# Patient Record
Sex: Male | Born: 1965 | Race: White | Hispanic: No
Health system: Southern US, Community
[De-identification: ages and names within clinical notes are randomized; demographics above are authoritative.]

---

## 2019-03-20 ENCOUNTER — Inpatient Hospital Stay (HOSPITAL_COMMUNITY)
Admission: AD | Admit: 2019-03-20 | Discharge: 2019-03-23 | DRG: 091 | Disposition: A | Discharge status: Home / Self Care | Payer: Self-pay | Source: Other Acute Inpatient Hospital | Attending: Internal Medicine | Admitting: Internal Medicine

## 2019-03-20 ENCOUNTER — Inpatient Hospital Stay (HOSPITAL_COMMUNITY): Payer: Self-pay

## 2019-03-20 DIAGNOSIS — J9602 Acute respiratory failure with hypercapnia: Secondary | ICD-10-CM | POA: Diagnosis present

## 2019-03-20 DIAGNOSIS — J9601 Acute respiratory failure with hypoxia: Secondary | ICD-10-CM | POA: Diagnosis present

## 2019-03-20 DIAGNOSIS — R4182 Altered mental status, unspecified: Secondary | ICD-10-CM

## 2019-03-20 DIAGNOSIS — G92 Toxic encephalopathy: Principal | ICD-10-CM | POA: Diagnosis present

## 2019-03-20 DIAGNOSIS — M6282 Rhabdomyolysis: Secondary | ICD-10-CM | POA: Diagnosis present

## 2019-03-20 DIAGNOSIS — Z20828 Contact with and (suspected) exposure to other viral communicable diseases: Secondary | ICD-10-CM | POA: Diagnosis present

## 2019-03-20 DIAGNOSIS — F1510 Other stimulant abuse, uncomplicated: Secondary | ICD-10-CM | POA: Diagnosis present

## 2019-03-20 DIAGNOSIS — G9340 Encephalopathy, unspecified: Secondary | ICD-10-CM | POA: Diagnosis present

## 2019-03-20 DIAGNOSIS — N179 Acute kidney failure, unspecified: Secondary | ICD-10-CM

## 2019-03-20 DIAGNOSIS — E875 Hyperkalemia: Secondary | ICD-10-CM | POA: Diagnosis present

## 2019-03-20 MED ORDER — ORAL CARE MOUTH RINSE
15.0000 mL | OROMUCOSAL | Status: DC
  Administered 2019-03-21 (×4): 15 mL via OROMUCOSAL

## 2019-03-20 MED ORDER — BISACODYL 10 MG RE SUPP: 10.0000 mg | Freq: Every day | RECTAL | Status: DC | PRN

## 2019-03-20 MED ORDER — DOCUSATE SODIUM 50 MG/5ML PO LIQD
100.0000 mg | Freq: Two times a day (BID) | ORAL | Status: DC | PRN
  Filled 2019-03-20: qty 10

## 2019-03-20 MED ORDER — FENTANYL BOLUS VIA INFUSION
50.0000 ug | INTRAVENOUS | Status: DC | PRN
  Filled 2019-03-20: qty 50

## 2019-03-20 MED ORDER — PROPOFOL 1000 MG/100ML IV EMUL
0.0000 ug/kg/min | INTRAVENOUS | Status: DC
  Administered 2019-03-21: 10 ug/kg/min via INTRAVENOUS

## 2019-03-20 MED ORDER — LACTATED RINGERS IV SOLN
INTRAVENOUS | Status: DC
  Administered 2019-03-21: via INTRAVENOUS

## 2019-03-20 MED ORDER — HEPARIN SODIUM (PORCINE) 5000 UNIT/ML IJ SOLN
5000.0000 [IU] | Freq: Three times a day (TID) | INTRAMUSCULAR | Status: DC
  Administered 2019-03-22 – 2019-03-23 (×3): 5000 [IU] via SUBCUTANEOUS
  Filled 2019-03-20 (×2): qty 1

## 2019-03-20 MED ORDER — FENTANYL 2500MCG IN NS 250ML (10MCG/ML) PREMIX INFUSION
50.0000 ug/h | INTRAVENOUS | Status: DC
  Administered 2019-03-20: 150 ug/h via INTRAVENOUS
  Filled 2019-03-20: qty 250

## 2019-03-20 MED ORDER — ALBUTEROL SULFATE (2.5 MG/3ML) 0.083% IN NEBU: 2.5000 mg | INHALATION_SOLUTION | Freq: Four times a day (QID) | RESPIRATORY_TRACT | Status: DC | PRN

## 2019-03-20 MED ORDER — CHLORHEXIDINE GLUCONATE 0.12% ORAL RINSE (MEDLINE KIT)
15.0000 mL | Freq: Two times a day (BID) | OROMUCOSAL | Status: DC
  Administered 2019-03-21 (×3): 15 mL via OROMUCOSAL

## 2019-03-20 MED ORDER — PROPOFOL 1000 MG/100ML IV EMUL
INTRAVENOUS | Status: AC
  Filled 2019-03-20: qty 100

## 2019-03-20 MED ORDER — FENTANYL CITRATE (PF) 100 MCG/2ML IJ SOLN: 50.0000 ug | Freq: Once | INTRAMUSCULAR | Status: DC

## 2019-03-20 MED ORDER — FAMOTIDINE IN NACL 20-0.9 MG/50ML-% IV SOLN
20.0000 mg | Freq: Two times a day (BID) | INTRAVENOUS | Status: DC
  Administered 2019-03-21 (×2): 20 mg via INTRAVENOUS
  Filled 2019-03-20 (×3): qty 50

## 2019-03-20 MED ORDER — SODIUM CHLORIDE 0.9 % IV SOLN
2.0000 g | Freq: Every day | INTRAVENOUS | Status: DC
  Administered 2019-03-21: 2 g via INTRAVENOUS
  Filled 2019-03-20: qty 20

## 2019-03-20 NOTE — H&P (Signed)
NAME:  Scott Ford, MRN:  [id number], DOB:  [birth year], LOS: 0 ADMISSION DATE:  (Not on file), CONSULTATION DATE:  03/20/19 , CHIEF COMPLAINT:  Altered mentation  Brief History   Patient with history of Meth use comes in for AMS. Intubated at [facility].   History of present illness   This is a 53 yo with history of meth amphematine abuse who presents to [facility] for chief complaint of agitation. Patient was apparently at home. The police had been contacted and were on the way to see the patient. Patient ran out of the house. Mother saw him. Of note he lives with mother. He was naked at this point. Apparently patient ran all the way to the hospital naked. In ED patient extremely agitated. Stating absurd things like "the doctors are trying to cut off my penis." Patient became increasingly combative at which point sedating medications were given. This caused concern for the patients airway and thus he was intubated. Patient presents here for further workup and management.   Notably Patient had CT head that was negative. CT C spine negative. LA initially 6 repeat was normal. Had AKI with creatinine of 2.81 Temp of 101.3 CK around 2000. CSF attained and notable for 0-WBC's 2537 RBC's.   Past Medical History  Meth abuse.   Significant Hospital Events   7/17-Admitted from [place]:  NA  Procedures:  NA  Significant Diagnostic Tests:  COVID-test re sent. [place] -pending UCX -pending  Micro Data:  Pending  Antimicrobials:  CTX  Interim history/subjective:  NA  Objective   There were no vitals taken for this visit. PAP: ()/()      No intake or output data in the 24 hours ending 03/20/19 2244 There were no vitals filed for this visit.  Examination: General: Patient intubated and sedated HENT: Moist mucopus membranes Lungs: CTAB Cardiovascular: RRR Abdomen: Soft non tender non distended. There is tattoo that states 100% white trash Extremities: Warm, good  pulses, no edema Neuro: Moves extremities spontaneously  GU: Foley in place, circumcised penis  Resolved Hospital Problem list   NA  Assessment & Plan:  This is a 53 yo with history of substance abuse who presents for acute agitation requiring sedation and intubation.   Encephalopathy NOS-Unlikely meningitis as no whites in CSF. CT head unremarkable making acute intracranial process unlikely. [place] from drug overdose especially in setting of positive UA -Fluids for now -Propofol and fentanyl for sedation and analgesia -CTX in case pneumonia or UTI. However infection seems less likely and fever likey from adrenergic overstimulation   AKI-Patient with creatinine of 2.81. Also with CK of 2000. 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  Family Communication: Have attempted but there are no numbers in chart Disposition: ICU for now  Labs   CBC: No results for input(s): WBC, NEUTROABS, HGB, HCT, MCV, PLT in the last 168 hours.  Basic Metabolic Panel: No results for input(s): NA, K, CL, CO2, GLUCOSE, BUN, CREATININE, CALCIUM, MG, PHOS in the last 168 hours. GFR: CrCl cannot be calculated (No successful lab  value found.). No results for input(s): PROCALCITON, WBC, LATICACIDVEN in the last 168 hours.  Liver Function Tests: No results for input(s): AST, ALT, ALKPHOS, BILITOT, PROT, ALBUMIN in the last 168 hours. No results for input(s): LIPASE, AMYLASE in the last 168 hours. No  results for input(s): AMMONIA in the last 168 hours.  ABG No results found for: PHART, PCO2ART, PO2ART, HCO3, TCO2, ACIDBASEDEF, O2SAT   Coagulation Profile: No results for input(s): INR, PROTIME in the last 168 hours.  Cardiac Enzymes: No results for input(s): CKTOTAL, CKMB, CKMBINDEX, TROPONINI in the last 168 hours.  HbA1C: No results found for: HGBA1C  CBG: No results for input(s): GLUCAP in the last 168 hours.  Review of Systems:     Past Medical History  He,  has no past medical history on file.   Surgical History      Social History      Family History   His family history is not on file.   Allergies Not on File   Home Medications  Prior to Admission medications   Not on File     Critical care time: 55 minutes

## 2019-03-21 ENCOUNTER — Inpatient Hospital Stay (HOSPITAL_COMMUNITY): Payer: Self-pay

## 2019-03-21 DIAGNOSIS — N179 Acute kidney failure, unspecified: Secondary | ICD-10-CM

## 2019-03-21 DIAGNOSIS — G9340 Encephalopathy, unspecified: Secondary | ICD-10-CM

## 2019-03-21 DIAGNOSIS — J9600 Acute respiratory failure, unspecified whether with hypoxia or hypercapnia: Secondary | ICD-10-CM

## 2019-03-21 LAB — POCT I-STAT 7, (LYTES, BLD GAS, ICA,H+H)
Acid-base deficit: 3 mmol/L — ABNORMAL HIGH (ref 0.0–2.0)
Bicarbonate: 24.2 mmol/L (ref 20.0–28.0)
Calcium, Ion: 1.26 mmol/L (ref 1.15–1.40)
HCT: 42 % (ref 39.0–52.0)
Hemoglobin: 14.3 g/dL (ref 13.0–17.0)
O2 Saturation: 99 %
Patient temperature: 97.6
Potassium: 3.9 mmol/L (ref 3.5–5.1)
Sodium: 144 mmol/L (ref 135–145)
TCO2: 26 mmol/L (ref 22–32)
pCO2 arterial: 50.7 mmHg — ABNORMAL HIGH (ref 32.0–48.0)
pH, Arterial: 7.283 — ABNORMAL LOW (ref 7.350–7.450)
pO2, Arterial: 161 mmHg — ABNORMAL HIGH (ref 83.0–108.0)

## 2019-03-21 LAB — CBC WITH DIFFERENTIAL/PLATELET
Abs Immature Granulocytes: 0.03 10*3/uL (ref 0.00–0.07)
Basophils Absolute: 0.1 10*3/uL (ref 0.0–0.1)
Basophils Relative: 1 %
Eosinophils Absolute: 0.3 10*3/uL (ref 0.0–0.5)
Eosinophils Relative: 2 %
HCT: 48 % (ref 39.0–52.0)
Hemoglobin: 15.6 g/dL (ref 13.0–17.0)
Immature Granulocytes: 0 %
Lymphocytes Relative: 17 %
Lymphs Abs: 1.8 10*3/uL (ref 0.7–4.0)
MCH: 32.2 pg (ref 26.0–34.0)
MCHC: 32.5 g/dL (ref 30.0–36.0)
MCV: 99.2 fL (ref 80.0–100.0)
Monocytes Absolute: 1.8 10*3/uL — ABNORMAL HIGH (ref 0.1–1.0)
Monocytes Relative: 17 %
Neutro Abs: 6.8 10*3/uL (ref 1.7–7.7)
Neutrophils Relative %: 63 %
Platelets: 173 10*3/uL (ref 150–400)
RBC: 4.84 MIL/uL (ref 4.22–5.81)
RDW: 14.7 % (ref 11.5–15.5)
WBC: 10.8 10*3/uL — ABNORMAL HIGH (ref 4.0–10.5)
nRBC: 0 % (ref 0.0–0.2)

## 2019-03-21 LAB — URINALYSIS, ROUTINE W REFLEX MICROSCOPIC
Bacteria, UA: NONE SEEN
Bilirubin Urine: NEGATIVE
Glucose, UA: NEGATIVE mg/dL
Ketones, ur: NEGATIVE mg/dL
Leukocytes,Ua: NEGATIVE
Nitrite: NEGATIVE
Protein, ur: 100 mg/dL — AB
Specific Gravity, Urine: 1.012 (ref 1.005–1.030)
pH: 5 (ref 5.0–8.0)

## 2019-03-21 LAB — COMPREHENSIVE METABOLIC PANEL
ALT: 37 U/L (ref 0–44)
AST: 82 U/L — ABNORMAL HIGH (ref 15–41)
Albumin: 4.4 g/dL (ref 3.5–5.0)
Alkaline Phosphatase: 42 U/L (ref 38–126)
Anion gap: 10 (ref 5–15)
BUN: 41 mg/dL — ABNORMAL HIGH (ref 6–20)
CO2: 19 mmol/L — ABNORMAL LOW (ref 22–32)
Calcium: 8.7 mg/dL — ABNORMAL LOW (ref 8.9–10.3)
Chloride: 112 mmol/L — ABNORMAL HIGH (ref 98–111)
Creatinine, Ser: 2.22 mg/dL — ABNORMAL HIGH (ref 0.61–1.24)
GFR calc Af Amer: 38 mL/min — ABNORMAL LOW (ref >60)
GFR calc non Af Amer: 33 mL/min — ABNORMAL LOW (ref >60)
Glucose, Bld: 80 mg/dL (ref 70–99)
Potassium: 5.2 mmol/L — ABNORMAL HIGH (ref 3.5–5.1)
Sodium: 141 mmol/L (ref 135–145)
Total Bilirubin: 1.7 mg/dL — ABNORMAL HIGH (ref 0.3–1.2)
Total Protein: 6.6 g/dL (ref 6.5–8.1)

## 2019-03-21 LAB — GLUCOSE, CAPILLARY
Glucose-Capillary: 127 mg/dL — ABNORMAL HIGH (ref 70–99)
Glucose-Capillary: 61 mg/dL — ABNORMAL LOW (ref 70–99)
Glucose-Capillary: 72 mg/dL (ref 70–99)
Glucose-Capillary: 85 mg/dL (ref 70–99)
Glucose-Capillary: 88 mg/dL (ref 70–99)
Glucose-Capillary: 93 mg/dL (ref 70–99)
Glucose-Capillary: 95 mg/dL (ref 70–99)

## 2019-03-21 LAB — HIV ANTIBODY (ROUTINE TESTING W REFLEX): HIV Screen 4th Generation wRfx: NONREACTIVE

## 2019-03-21 LAB — SODIUM, URINE, RANDOM: Sodium, Ur: 38 mmol/L

## 2019-03-21 LAB — SARS CORONAVIRUS 2 BY RT PCR (HOSPITAL ORDER, PERFORMED IN ~~LOC~~ HOSPITAL LAB): SARS Coronavirus 2: NEGATIVE

## 2019-03-21 LAB — LACTIC ACID, PLASMA
Lactic Acid, Venous: 1.3 mmol/L (ref 0.5–1.9)
Lactic Acid, Venous: 0.8 mmol/L (ref 0.5–1.9)

## 2019-03-21 LAB — TRIGLYCERIDES: Triglycerides: 103 mg/dL (ref <150)

## 2019-03-21 LAB — CREATININE, URINE, RANDOM: Creatinine, Urine: 124.34 mg/dL

## 2019-03-21 LAB — MRSA PCR SCREENING: MRSA by PCR: POSITIVE — AB

## 2019-03-21 LAB — PROCALCITONIN: Procalcitonin: 3.19 ng/mL

## 2019-03-21 LAB — PHOSPHORUS: Phosphorus: 4.5 mg/dL (ref 2.5–4.6)

## 2019-03-21 LAB — CK: Total CK: 4613 U/L — ABNORMAL HIGH (ref 49–397)

## 2019-03-21 LAB — MAGNESIUM: Magnesium: 2.8 mg/dL — ABNORMAL HIGH (ref 1.7–2.4)

## 2019-03-21 MED ORDER — LORAZEPAM 2 MG/ML IJ SOLN
1.0000 mg | INTRAMUSCULAR | Status: DC | PRN
  Administered 2019-03-21 – 2019-03-22 (×3): 2 mg via INTRAVENOUS
  Filled 2019-03-21 (×3): qty 1

## 2019-03-21 MED ORDER — DEXTROSE 50 % IV SOLN
INTRAVENOUS | Status: AC
  Administered 2019-03-21: 50 mL
  Filled 2019-03-21: qty 50

## 2019-03-21 MED ORDER — DEXMEDETOMIDINE HCL IN NACL 400 MCG/100ML IV SOLN
0.4000 ug/kg/h | INTRAVENOUS | Status: DC
  Administered 2019-03-21: 12:00:00 0.4 ug/kg/h via INTRAVENOUS
  Administered 2019-03-21: 1.2 ug/kg/h via INTRAVENOUS
  Filled 2019-03-21 (×2): qty 100

## 2019-03-21 MED ORDER — THIAMINE HCL 100 MG/ML IJ SOLN
100.0000 mg | Freq: Every day | INTRAMUSCULAR | Status: DC
  Administered 2019-03-21: 100 mg via INTRAVENOUS
  Filled 2019-03-21: qty 2

## 2019-03-21 MED ORDER — MUPIROCIN 2 % EX OINT
1.0000 "application " | TOPICAL_OINTMENT | Freq: Two times a day (BID) | CUTANEOUS | Status: DC
  Administered 2019-03-21 – 2019-03-22 (×5): 1 via NASAL
  Filled 2019-03-21 (×3): qty 22

## 2019-03-21 MED ORDER — DEXTROSE IN LACTATED RINGERS 5 % IV SOLN
INTRAVENOUS | Status: DC
  Administered 2019-03-21 – 2019-03-23 (×2): via INTRAVENOUS

## 2019-03-21 MED ORDER — CHLORHEXIDINE GLUCONATE CLOTH 2 % EX PADS: 6.0000 | MEDICATED_PAD | Freq: Every day | CUTANEOUS | Status: DC

## 2019-03-21 MED ORDER — ADULT MULTIVITAMIN W/MINERALS CH
1.0000 | ORAL_TABLET | Freq: Every day | ORAL | Status: DC
  Administered 2019-03-21 – 2019-03-23 (×3): 1 via ORAL
  Filled 2019-03-21 (×3): qty 1

## 2019-03-21 MED ORDER — ORAL CARE MOUTH RINSE
15.0000 mL | Freq: Two times a day (BID) | OROMUCOSAL | Status: DC
  Administered 2019-03-21 (×2): 15 mL via OROMUCOSAL

## 2019-03-21 MED ORDER — PROPOFOL 1000 MG/100ML IV EMUL
0.0000 ug/kg/min | INTRAVENOUS | Status: DC
  Administered 2019-03-21: 10 ug/kg/min via INTRAVENOUS

## 2019-03-21 NOTE — Progress Notes (Signed)
NAME:  Scott Ford, MRN:  [id number], DOB:  [birth year], LOS: 1 ADMISSION DATE:  03/20/2019, CONSULTATION DATE:  03/20/19 , CHIEF COMPLAINT:  Altered mentation  Brief History   Patient with history of Meth use comes in for AMS. [facility]   History of present illness   This is a 53 yo with history of meth amphematine abuse who [facility]. Patient was apparently at home. The police had been contacted and were on the way to see the patient. Patient ran out of the house. Mother saw him. Of note he lives with mother. He was naked at this point. Apparently patient ran all the way to the hospital naked. In ED patient extremely agitated. Stating absurd things like "the doctors are trying to cut off my penis." Patient became increasingly combative at which point sedating medications were given. This caused concern for the patients airway and thus he was intubated. Patient presents here for further workup and management.   Notably Patient had CT head that was negative. CT C spine negative. LA initially 6 repeat was normal. Had AKI with creatinine of 2.81 Temp of 101.3 CK around 2000. CSF attained and notable for 0-WBC's 2537 RBC's.   Past Medical History  Meth abuse.   Significant Hospital Events   [facility]  Consults:  NA  Procedures:  NA  Significant Diagnostic Tests:  Head CT 7/17 neg US renal 7/18 >> neg  Micro Data:  COVID-test neg BCX >> UCX >>  Antimicrobials:  CTX 7/18 >>  Interim history/subjective:   Calmer this morning when propofol drip turned off Afebrile   Objective   Blood pressure 102/70, pulse 76, temperature 97.6 F (36.4 C), temperature source Oral, resp. rate (!) 24, height 6' (1.829 m), weight 73.5 kg, SpO2 100 %.    Vent Mode: PSV;CPAP FiO2 (%):  [30 %-40 %] 30 % Set Rate:  [18 bmp-24 bmp] 24 bmp Vt Set:  [450 mL] 450 mL PEEP:  [5 cmH20] 5 cmH20 Pressure  Support:  [5 cmH20] 5 cmH20 Plateau Pressure:  [12 cmH20-15 cmH20] 15 cmH20   Intake/Output Summary (Last 24 hours) at 03/21/2019 0833 Last data filed at 03/21/2019 0800 Gross per 24 hour  Intake 1467.48 ml  Output 410 ml  Net 1057.48 ml   Filed Weights   03/20/19 2330 03/21/19 0414  Weight: 73.4 kg 73.5 kg    Examination: General: Patient intubated and sedated HENT: Moist mucopus membranes, no pallor or icterus Lungs: CTAB Cardiovascular: RRR Abdomen: Soft non tender non distended.  Tattoo noted -' 100% white trash' Extremities: Warm, good pulses, no edema Neuro: Moves extremities spontaneously  GU: Foley in place, circumcised penis  Chest x-ray 7/17 personally reviewed no infiltrates  Resolved Hospital Problem list   NA  Assessment & Plan:  This is a 53 yo with history of substance abuse who presents for acute agitation requiring sedation and intubation.   Acute Encephalopathy NOS-Unlikely meningitis as no whites in CSF. CT head unremarkable making acute intracranial process unlikely. Likely from drug overdose especially in setting of positive UA -resolved -We will use Precedex for agitation and monitor for withdrawal -Discontinue ceftriaxone, fever likey from adrenergic overstimulation  Polysubstance abuse- Patient with history of meth abuse -Amphetamines positive  -Monitor for withdrawal -MVI and Thiamine  Acute respiratory failure-intubated for agitation/airway protection -Tolerating pressure support 5/5 this morning and calmer, will proceed with extubation  AKI-Patient with creatinine of 2.81. 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  Family Communication: no contacts identified Disposition: ICU, transfer to floor if agitation controlled  The patient is critically ill with multiple organ systems failure and requires high complexity decision making for assessment and support, frequent evaluation and titration of therapies, application of advanced monitoring technologies and extensive interpretation of multiple databases. Critical Care Time devoted to patient care services described in this note independent of APP/resident  time is 32 minutes.   Kara Mead MD. Shade Flood. [facility] Pager [phone] If no response call [phone]   03/21/2019

## 2019-03-21 NOTE — Progress Notes (Signed)
Pt is alert and answers questions appropriately but is acting confused, such as pulling at lines and asking repetitive questions. He ripped out one of his IVs and ripped out his foley catheter. Precedex started.

## 2019-03-21 NOTE — Progress Notes (Addendum)
Pt had a sudden episode of anxiety. He got out of the bed and stood up stating he was "getting out of here" and expressed concern about the cops coming for him that he ripped off all his cardiac leads and pulse ox; he broke the cord of his BP cuff when attempting to remove it and ripped out the IV in his left hand (pressure applied, bleeding controlled immediately). Multiple nurses in room to deescalate the patient. Dr. Elsworth Soho also happened to be on the unit to witness this episode. After about 3-5 minutes of convincing the patient to get back into the bed, he complied and returned to the bed. Pt finally calmed down.Pt able to be deescalated. Paper scrub pants provided to patient as requested. Staff RN at his bedside as 1:1 Actuary.

## 2019-03-21 NOTE — Progress Notes (Signed)
Wasted IV medications from [facility] hospital with Brien Mates, RN:  Bags mixed in transport. Switching to our IV medication bags and tubing.   Fentanyl- 252mL Versed- 10 mL Ativan- 150 mL  Weldon Inches, RN

## 2019-03-21 NOTE — Progress Notes (Signed)
Hypoglycemic event.  Glucose: 61.   Gave 1 amp of dextrose. MD added LR with D5 at 150 mL/hr. Will continue to monitor.   Weldon Inches, RN

## 2019-03-21 NOTE — Procedures (Signed)
Extubation Procedure Note  Patient Details:   Name: Scott Ford DOB: September 17, 1965 MRN: [id number]   Airway Documentation:    Vent end date: 03/21/19 Vent end time: 0830   Evaluation  O2 sats: stable throughout Complications: No apparent complications Patient did tolerate procedure well. Bilateral Breath Sounds: Clear, Diminished   Yes   Patient extubated to 3L [place] without complications. RN at bedside. Positive cuff leak. No stridor noted.   Herbie Baltimore 03/21/2019, 8:37 AM

## 2019-03-22 DIAGNOSIS — T796XXA Traumatic ischemia of muscle, initial encounter: Secondary | ICD-10-CM

## 2019-03-22 LAB — BASIC METABOLIC PANEL
Anion gap: 8 (ref 5–15)
BUN: 30 mg/dL — ABNORMAL HIGH (ref 6–20)
CO2: 24 mmol/L (ref 22–32)
Calcium: 8.5 mg/dL — ABNORMAL LOW (ref 8.9–10.3)
Chloride: 110 mmol/L (ref 98–111)
Creatinine, Ser: 1.42 mg/dL — ABNORMAL HIGH (ref 0.61–1.24)
GFR calc Af Amer: >60 mL/min (ref >60)
GFR calc non Af Amer: 56 mL/min — ABNORMAL LOW (ref >60)
Glucose, Bld: 84 mg/dL (ref 70–99)
Potassium: 4.3 mmol/L (ref 3.5–5.1)
Sodium: 142 mmol/L (ref 135–145)

## 2019-03-22 LAB — CBC
HCT: 44.5 % (ref 39.0–52.0)
Hemoglobin: 15 g/dL (ref 13.0–17.0)
MCH: 32.3 pg (ref 26.0–34.0)
MCHC: 33.7 g/dL (ref 30.0–36.0)
MCV: 95.7 fL (ref 80.0–100.0)
Platelets: 177 10*3/uL (ref 150–400)
RBC: 4.65 MIL/uL (ref 4.22–5.81)
RDW: 14.2 % (ref 11.5–15.5)
WBC: 7.2 10*3/uL (ref 4.0–10.5)
nRBC: 0 % (ref 0.0–0.2)

## 2019-03-22 LAB — PHOSPHORUS: Phosphorus: 3.4 mg/dL (ref 2.5–4.6)

## 2019-03-22 LAB — URINE CULTURE: Culture: NO GROWTH

## 2019-03-22 LAB — CK: Total CK: 2792 U/L — ABNORMAL HIGH (ref 49–397)

## 2019-03-22 LAB — MAGNESIUM: Magnesium: 2.3 mg/dL (ref 1.7–2.4)

## 2019-03-22 MED ORDER — FAMOTIDINE 20 MG PO TABS
20.0000 mg | ORAL_TABLET | Freq: Two times a day (BID) | ORAL | Status: DC
  Administered 2019-03-22 – 2019-03-23 (×3): 20 mg via ORAL
  Filled 2019-03-22 (×3): qty 1

## 2019-03-22 MED ORDER — VITAMIN B-1 100 MG PO TABS
100.0000 mg | ORAL_TABLET | Freq: Every day | ORAL | Status: DC
  Administered 2019-03-22 – 2019-03-23 (×2): 100 mg via ORAL
  Filled 2019-03-22 (×2): qty 1

## 2019-03-22 NOTE — Progress Notes (Signed)
PT Cancellation Note  Patient Details Name: Scott Ford MRN: [id number] DOB: Aug 15, 1966   Cancelled Treatment:    Reason Eval/Treat Not Completed: Patient declined, no reason specified. Adamantly refusing physical therapy evaluation due to fatigue. Would not open eyes or engage with therapist whatsoever.   Ellamae Sia, PT, DPT Acute Rehabilitation Services Pager [phone] Office [phone]    Willy Eddy 03/22/2019, 2:39 PM

## 2019-03-22 NOTE — Discharge Summary (Addendum)
Physician Discharge Summary  Scott Ford RSW:[id number] DOB: 01/06/1966 DOA: 03/20/2019  PCP: Patient, No Pcp Per  Admit date: 03/20/2019 Discharge date: 03/23/2019  Admitted From: Home  Disposition:  Home   Recommendations for Outpatient Follow-up and new medication changes:  1. Follow up with Primary Care in 7 days.  2. I spoke with his mother and advised to follow up with behavioral health as outpatient.   Home Health: no Equipment/Devices: no    Discharge Condition: stable  CODE STATUS: full  Diet recommendation: regular.    Brief/Interim Summary: 53 year old male who presented with altered mentation.  He does have the significant past medical history for methamphetamine abuse.  He presented to [facility] emergency department with severe agitation.  He was confused, disoriented and combative he received sedatives to control his agitation along with intubation to protect his airway.  On his initial physical examination he was hemodynamically stable, but febrile, 101.3, he was intubated and sedated, lungs were clear to auscultation, heart S1-S2 present regular, abdomen was soft, no lower extremity edema.  Head and cervical spine CT were negative.  His creatinine was 2.81, CK 2000, CSF fluid with 0 white cells.   Patient was admitted to the intensive care unit with a working diagnosis of acute toxic/metabolic encephalopathy complicated by CNS related respiratory failure, acute kidney injury and rhabdomyolysis.  1. Acute respiratory failure due to CNS dysfunction, toxic-metabolic encephalopathy. Patient has been extubated with good toleration, off precedex since 15:00 03/21/19. This am is calm and appropriate, does not recall what triggered with agitation on admission. Neurologically non focal. Chest film personally reviewed noted right lower lobe atelectasis. This am oxygenating 98% on room air. I spoke with his mother who will take him home. Apparently patient got out of  jail the day before admission.   2.  AKI with rhabdomyolysis. Patient tolerating po well, renal function continue to improve with serum cr 1,28 with CK down to  872. K at 3,9 and serum bicarbonate at 23.  His CK peaked at 4613 on 03/21/19.  3. Substance abuse. Advised for follow up as outpatient. Today patient is not agitated, not suicidal or homicidal.   Discharge Diagnoses:  Principal Problem:   Acute encephalopathy Active Problems:   AKI (acute kidney injury) ([place])   Rhabdomyolysis   Acute respiratory failure with hypoxia and hypercapnia ([facility])    Discharge Instructions   Allergies as of 03/22/2019   Not on File     Medication List    You have not been prescribed any medications.     Not on File  Consultations:     Procedures/Studies: US Renal  Result Date: 03/21/2019 CLINICAL DATA:  Initial evaluation for acute renal injury. EXAM: RENAL / URINARY TRACT ULTRASOUND COMPLETE COMPARISON:  None. FINDINGS: Right Kidney: Renal measurements: 11.5 x 5.4 x 6.6 cm = volume: 212.3 mL. Diffusely increased echogenicity within the renal parenchyma. No hydronephrosis. No shadowing echogenic foci to suggest nephrolithiasis. No focal renal mass. Left Kidney: Renal measurements: 10.9 x 6.4 x 5.3 cm = volume: 191.9 mL. Diffusely increased echogenicity within the renal parenchyma. No hydronephrosis. No shadowing echogenic foci to suggest nephrolithiasis. No focal renal mass. Bladder: Bladder decompressed with a Foley catheter in place. IMPRESSION: 1. Increased echogenicity within the renal parenchyma bilaterally, compatible with medical renal disease. 2. No hydronephrosis. 3. Bladder decompressed with Foley catheter in place. Electronically Signed   By: Jeannine Boga M.D.   On: 03/21/2019 07:34   Dg Chest [facility] 1 View  Result  Date: 03/21/2019 CLINICAL DATA:  Intubated EXAM: PORTABLE CHEST 1 VIEW COMPARISON:  None. FINDINGS: ET tube is 6 cm above the carina. Heart is normal size. No  confluent opacities or effusions. No acute bony abnormality. IMPRESSION: Endotracheal tube 6 cm above the carina. No acute cardiopulmonary disease. Electronically Signed   By: Charlett NoseKevin  Dover M.D.   On: 03/21/2019 00:03      Procedures:   Subjective: Patient is feeling well, no nausea or vomiting, he is tolerating po well, no chest pain.   Discharge Exam: Vitals:   03/22/19 0900 03/22/19 1000  BP: 111/87 (!) 87/54  Pulse:    Resp: (!) 24 15  Temp:    SpO2:  98%   Vitals:   03/22/19 0600 03/22/19 0800 03/22/19 0900 03/22/19 1000  BP: 112/69 91/64 111/87 (!) 87/54  Pulse:      Resp: 15 14 (!) 24 15  Temp:  97.9 F (36.6 C)    TempSrc:  Oral    SpO2:  98%  98%  Weight:      Height:        General: Not in pain or dyspnea  Neurology: Awake and alert, non focal  E ENT: no pallor, no icterus, oral mucosa moist Cardiovascular: No JVD. S1-S2 present, rhythmic, no gallops, rubs, or murmurs. No lower extremity edema. Pulmonary: positive breath sounds bilaterally, adequate air movement, no wheezing, rhonchi or rales. Gastrointestinal. Abdomen with no organomegaly, non tender, no rebound or guarding Skin. No rashes Musculoskeletal: no joint deformities   The results of significant diagnostics from this hospitalization (including imaging, microbiology, ancillary and laboratory) are listed below for reference.     Microbiology: Recent Results (from the past 240 hour(s))  SARS Coronavirus 2 [facility](Hospital order, Performed in [facility] hospital lab)     Status: None   Collection Time: 03/20/19 11:47 PM   Specimen: Nasopharyngeal Swab  Result Value Ref Range Status   SARS Coronavirus 2 NEGATIVE NEGATIVE Final    Comment: (NOTE) If result is NEGATIVE SARS-CoV-2 target nucleic acids are NOT DETECTED. The SARS-CoV-2 RNA is generally detectable in upper and lower  respiratory specimens during the acute phase of infection. The lowest  concentration of SARS-CoV-2 viral copies this assay  can detect is 250  copies / mL. A negative result does not preclude SARS-CoV-2 infection  and should not be used as the sole basis for treatment or other  patient management decisions.  A negative result may occur with  improper specimen collection / handling, submission of specimen other  than nasopharyngeal swab, presence of viral mutation(s) within the  areas targeted by this assay, and inadequate number of viral copies  (<250 copies / mL). A negative result must be combined with clinical  observations, patient history, and epidemiological information. If result is POSITIVE SARS-CoV-2 target nucleic acids are DETECTED. The SARS-CoV-2 RNA is generally detectable in upper and lower  respiratory specimens dur ing the acute phase of infection.  Positive  results are indicative of active infection with SARS-CoV-2.  Clinical  correlation with patient history and other diagnostic information is  necessary to determine patient infection status.  Positive results do  not rule out bacterial infection or co-infection with other viruses. If result is PRESUMPTIVE POSTIVE SARS-CoV-2 nucleic acids MAY BE PRESENT.   A presumptive positive result was obtained on the submitted specimen  and confirmed on repeat testing.  While 2019 novel coronavirus  (SARS-CoV-2) nucleic acids may be present in the submitted sample  additional confirmatory testing may  be necessary for epidemiological  and / or clinical management purposes  to differentiate between  SARS-CoV-2 and other Sarbecovirus currently known to infect humans.  If clinically indicated additional testing with an alternate test  methodology [phone] is advised. The SARS-CoV-2 RNA is generally  detectable in upper and lower respiratory sp ecimens during the acute  phase of infection. The expected result is Negative. Fact Sheet for Patients:  BoilerBrush.com.cyhttps://www.fda.gov/media/136312/download Fact Sheet for Healthcare  Providers: https://pope.com/https://www.fda.gov/media/136313/download This test is not yet approved or cleared by the [place] FDA and has been authorized for detection and/or diagnosis of SARS-CoV-2 by FDA under an Emergency Use Authorization (EUA).  This EUA will remain in effect (meaning this test can be used) for the duration of the COVID-19 declaration under Section 564(b)(1) of the Act, 21 U.S.C. section 360bbb-3(b)(1), unless the authorization is terminated or revoked sooner. Performed at [facility], [address] [place], [place] [address]   Culture, blood (routine x 2)     Status: None (Preliminary result)   Collection Time: 03/21/19 12:20 AM   Specimen: BLOOD RIGHT WRIST  Result Value Ref Range Status   Specimen Description BLOOD RIGHT WRIST  Final   Special Requests AEROBIC BOTTLE ONLY Blood Culture adequate volume  Final   Culture   Final    NO GROWTH 1 DAY Performed at [facility], [address] [place], [place] [address]    Report Status PENDING  Incomplete  Culture, blood (routine x 2)     Status: None (Preliminary result)   Collection Time: 03/21/19 12:26 AM   Specimen: BLOOD  Result Value Ref Range Status   Specimen Description BLOOD RIGHT ANTECUBITAL  Final   Special Requests AEROBIC BOTTLE ONLY Blood Culture adequate volume  Final   Culture   Final    NO GROWTH 1 DAY Performed at [facility], [address] [place], [place] [address]    Report Status PENDING  Incomplete  Culture, Urine     Status: None   Collection Time: 03/21/19 12:26 AM   Specimen: Urine, Random  Result Value Ref Range Status   Specimen Description URINE, RANDOM  Final   Special Requests NONE  Final   Culture   Final    NO GROWTH Performed at [facility], [address] [place], [place] [address]    Report Status 03/22/2019 FINAL  Final  MRSA PCR Screening     Status: Abnormal   Collection Time: 03/21/19 12:34 AM   Specimen: Nasal Mucosa; Nasopharyngeal   Result Value Ref Range Status   MRSA by PCR POSITIVE (A) NEGATIVE Final    Comment:        The GeneXpert MRSA Assay (FDA approved for NASAL specimens only), is one component of a comprehensive MRSA colonization surveillance program. It is not intended to diagnose MRSA infection nor to guide or monitor treatment for MRSA infections. RESULT CALLED TO, READ BACK BY AND VERIFIED WITH: Irine Seal ANDERSON RN 03/21/19 0200 JDW Performed at [facility], [address] [place], [place] [address]   Culture, respiratory (non-expectorated)     Status: None (Preliminary result)   Collection Time: 03/21/19  3:12 AM   Specimen: Tracheal Aspirate; Respiratory  Result Value Ref Range Status   Specimen Description TRACHEAL ASPIRATE  Final   Special Requests NONE  Final   Gram Stain   Final    ABUNDANT WBC PRESENT, PREDOMINANTLY PMN FEW GRAM POSITIVE COCCI Performed at [facility], [address] [place], [place] [address]    Culture PENDING  Incomplete   Report Status PENDING  Incomplete     Labs: BNP (last 3 results) No results for input(s): BNP in the last 8760 hours. Basic Metabolic Panel: Recent Labs  Lab 03/21/19 0038 03/21/19 0204 03/22/19 0841  NA 141 144 142  K 5.2* 3.9 4.3  CL 112*  --  110  CO2 19*  --  24  GLUCOSE 80  --  84  BUN 41*  --  30*  CREATININE 2.22*  --  1.42*  CALCIUM 8.7*  --  8.5*  MG 2.8*  --  2.3  PHOS 4.5  --  3.4   Liver Function Tests: Recent Labs  Lab 03/21/19 0038  AST 82*  ALT 37  ALKPHOS 42  BILITOT 1.7*  PROT 6.6  ALBUMIN 4.4   No results for input(s): LIPASE, AMYLASE in the last 168 hours. No results for input(s): AMMONIA in the last 168 hours. CBC: Recent Labs  Lab 03/21/19 0204 03/21/19 0242 03/22/19 0841  WBC  --  10.8* 7.2  NEUTROABS  --  6.8  --   HGB 14.3 15.6 15.0  HCT 42.0 48.0 44.5  MCV  --  99.2 95.7  PLT  --  173 177   Cardiac Enzymes: Recent Labs  Lab 03/21/19 0038 03/22/19 0841  CKTOTAL 4,613*  2,792*   BNP: Invalid input(s): POCBNP CBG: Recent Labs  Lab 03/21/19 0425 03/21/19 0442 03/21/19 0558 03/21/19 0733 03/21/19 1149  GLUCAP 72 95 93 85 127*   D-Dimer No results for input(s): DDIMER in the last 72 hours. Hgb A1c No results for input(s): HGBA1C in the last 72 hours. Lipid Profile Recent Labs    03/21/19 0242  TRIG 103   Thyroid function studies No results for input(s): TSH, T4TOTAL, T3FREE, THYROIDAB in the last 72 hours.  Invalid input(s): FREET3 Anemia work up No results for input(s): VITAMINB12, FOLATE, FERRITIN, TIBC, IRON, RETICCTPCT in the last 72 hours. Urinalysis    Component Value Date/Time   COLORURINE YELLOW 03/21/2019 0026   APPEARANCEUR TURBID (A) 03/21/2019 0026   LABSPEC 1.012 03/21/2019 0026   PHURINE 5.0 03/21/2019 0026   GLUCOSEU NEGATIVE 03/21/2019 0026   HGBUR LARGE (A) 03/21/2019 0026   BILIRUBINUR NEGATIVE 03/21/2019 0026   KETONESUR NEGATIVE 03/21/2019 0026   PROTEINUR 100 (A) 03/21/2019 0026   NITRITE NEGATIVE 03/21/2019 0026   LEUKOCYTESUR NEGATIVE 03/21/2019 0026   Sepsis Labs Invalid input(s): PROCALCITONIN,  WBC,  LACTICIDVEN Microbiology Recent Results (from the past 240 hour(s))  SARS Coronavirus 2 [facility](Hospital order, Performed in [facility] hospital lab)     Status: None   Collection Time: 03/20/19 11:47 PM   Specimen: Nasopharyngeal Swab  Result Value Ref Range Status   SARS Coronavirus 2 NEGATIVE NEGATIVE Final    Comment: (NOTE) If result is NEGATIVE SARS-CoV-2 target nucleic acids are NOT DETECTED. The SARS-CoV-2 RNA is generally detectable in upper and lower  respiratory specimens during the acute phase of infection. The lowest  concentration of SARS-CoV-2 viral copies this assay can detect is 250  copies / mL. A negative result does not preclude SARS-CoV-2 infection  and should not be used as the sole basis for treatment or other  patient management decisions.  A negative result may occur with  improper  specimen collection / handling, submission of specimen other  than nasopharyngeal swab, presence of viral mutation(s) within the  areas targeted by this assay, and inadequate number  of viral copies  (<250 copies / mL). A negative result must be combined with clinical  observations, patient history, and epidemiological information. If result is POSITIVE SARS-CoV-2 target nucleic acids are DETECTED. The SARS-CoV-2 RNA is generally detectable in upper and lower  respiratory specimens dur ing the acute phase of infection.  Positive  results are indicative of active infection with SARS-CoV-2.  Clinical  correlation with patient history and other diagnostic information is  necessary to determine patient infection status.  Positive results do  not rule out bacterial infection or co-infection with other viruses. If result is PRESUMPTIVE POSTIVE SARS-CoV-2 nucleic acids MAY BE PRESENT.   A presumptive positive result was obtained on the submitted specimen  and confirmed on repeat testing.  While 2019 novel coronavirus  (SARS-CoV-2) nucleic acids may be present in the submitted sample  additional confirmatory testing may be necessary for epidemiological  and / or clinical management purposes  to differentiate between  SARS-CoV-2 and other Sarbecovirus currently known to infect humans.  If clinically indicated additional testing with an alternate test  methodology [phone]) is advised. The SARS-CoV-2 RNA is generally  detectable in upper and lower respiratory sp ecimens during the acute  phase of infection. The expected result is Negative. Fact Sheet for Patients:  BoilerBrush.com.cy Fact Sheet for Healthcare Providers: https://pope.com/ This test is not yet approved or cleared by the [place] FDA and has been authorized for detection and/or diagnosis of SARS-CoV-2 by FDA under an Emergency Use Authorization (EUA).  This EUA will remain in  effect (meaning this test can be used) for the duration of the COVID-19 declaration under Section 564(b)(1) of the Act, 21 U.S.C. section 360bbb-3(b)(1), unless the authorization is terminated or revoked sooner. Performed at [facility], [address], [place], [place] [address]   Culture, blood (routine x 2)     Status: None (Preliminary result)   Collection Time: 03/21/19 12:20 AM   Specimen: BLOOD RIGHT WRIST  Result Value Ref Range Status   Specimen Description BLOOD RIGHT WRIST  Final   Special Requests AEROBIC BOTTLE ONLY Blood Culture adequate volume  Final   Culture   Final    NO GROWTH 1 DAY Performed at [facility], [address] [place], [place] [address]    Report Status PENDING  Incomplete  Culture, blood (routine x 2)     Status: None (Preliminary result)   Collection Time: 03/21/19 12:26 AM   Specimen: BLOOD  Result Value Ref Range Status   Specimen Description BLOOD RIGHT ANTECUBITAL  Final   Special Requests AEROBIC BOTTLE ONLY Blood Culture adequate volume  Final   Culture   Final    NO GROWTH 1 DAY Performed at [facility], [address], [place], [place] [address]    Report Status PENDING  Incomplete  Culture, Urine     Status: None   Collection Time: 03/21/19 12:26 AM   Specimen: Urine, Random  Result Value Ref Range Status   Specimen Description URINE, RANDOM  Final   Special Requests NONE  Final   Culture   Final    NO GROWTH Performed at [facility], [address] [place], [place] [address]    Report Status 03/22/2019 FINAL  Final  MRSA PCR Screening     Status: Abnormal   Collection Time: 03/21/19 12:34 AM   Specimen: Nasal Mucosa; Nasopharyngeal  Result Value Ref Range Status   MRSA by PCR POSITIVE (A) NEGATIVE Final  Comment:        The GeneXpert MRSA Assay (FDA approved for NASAL specimens only), is one component of a comprehensive MRSA colonization surveillance program. It is not intended to diagnose  MRSA infection nor to guide or monitor treatment for MRSA infections. RESULT CALLED TO, READ BACK BY AND VERIFIED WITH: Irine Seal RN 03/21/19 0200 JDW Performed at [facility], [address], [place], [place] [address]   Culture, respiratory (non-expectorated)     Status: None (Preliminary result)   Collection Time: 03/21/19  3:12 AM   Specimen: Tracheal Aspirate; Respiratory  Result Value Ref Range Status   Specimen Description TRACHEAL ASPIRATE  Final   Special Requests NONE  Final   Gram Stain   Final    ABUNDANT WBC PRESENT, PREDOMINANTLY PMN FEW GRAM POSITIVE COCCI Performed at [facility], [address], [place], [place] [address]    Culture PENDING  Incomplete   Report Status PENDING  Incomplete     Time coordinating discharge: 45 minutes  SIGNED:   Coralie Keens, MD  [facility] 03/22/2019, 10:41 AM

## 2019-03-22 NOTE — Plan of Care (Signed)
  Problem: Education: Goal: Knowledge of General Education information will improve Description: Including pain rating scale, medication(s)/side effects and non-pharmacologic comfort measures Outcome: Progressing   Problem: Safety: Goal: Ability to remain free from injury will improve Outcome: Progressing   

## 2019-03-22 NOTE — Progress Notes (Signed)
Spoke with Blanch Media, pt's mom, updated her on his status.  She was notified patient will be kept for an extra day and will be transferred to medical surgical unit once a bed becomes available.  Will continue to monitor patient.

## 2019-03-23 DIAGNOSIS — M6282 Rhabdomyolysis: Secondary | ICD-10-CM

## 2019-03-23 DIAGNOSIS — J9601 Acute respiratory failure with hypoxia: Secondary | ICD-10-CM

## 2019-03-23 DIAGNOSIS — N179 Acute kidney failure, unspecified: Secondary | ICD-10-CM

## 2019-03-23 DIAGNOSIS — J9602 Acute respiratory failure with hypercapnia: Secondary | ICD-10-CM

## 2019-03-23 LAB — BASIC METABOLIC PANEL
Anion gap: 8 (ref 5–15)
BUN: 23 mg/dL — ABNORMAL HIGH (ref 6–20)
CO2: 23 mmol/L (ref 22–32)
Calcium: 8.6 mg/dL — ABNORMAL LOW (ref 8.9–10.3)
Chloride: 111 mmol/L (ref 98–111)
Creatinine, Ser: 1.28 mg/dL — ABNORMAL HIGH (ref 0.61–1.24)
GFR calc Af Amer: >60 mL/min (ref >60)
GFR calc non Af Amer: >60 mL/min (ref >60)
Glucose, Bld: 84 mg/dL (ref 70–99)
Potassium: 3.9 mmol/L (ref 3.5–5.1)
Sodium: 142 mmol/L (ref 135–145)

## 2019-03-23 LAB — CK: Total CK: 872 U/L — ABNORMAL HIGH (ref 49–397)

## 2019-03-23 LAB — CULTURE, RESPIRATORY W GRAM STAIN

## 2019-03-23 NOTE — Progress Notes (Signed)
Scott Ford is feeling well this morning.  No nausea, no vomiting, no chest pain or dyspnea.  His vital signs have remained stable, temperature 97.6, blood pressure 103/72, heart rate 77, respiratory rate 18, oxygen saturation 96% on room air.  He is awake and alert, moist mucous membranes, lungs clear to auscultation bilaterally, heart S1-S2 present and rhythmic, abdomen soft, no lower extremity edema.  His CK is down to 872, sodium 142, potassium 3.9, chloride 111, bicarb 23, glucose 84, BUN 23, creatinine 1.28, calcium 8.6.  His toxic encephalopathy has resolved.  His acute kidney injury and rhabdomyolysis are improving.  Patient is tolerating p.o. diet adequately.  Plan to discharge patient home, follow-up with primary care within 7 days.

## 2019-03-23 NOTE — Progress Notes (Signed)
DISCHARGE NOTE HOME Yuji Walth to be discharged Home per MD order. Discussed prescriptions and follow up appointments with the patient. Prescriptions given to patient; medication list explained in detail. Patient verbalized understanding.  Skin clean, dry and intact without evidence of skin break down, no evidence of skin tears noted. IV catheter discontinued intact. Site without signs and symptoms of complications. Dressing and pressure applied. Pt denies pain at the site currently. No complaints noted.  Patient free of lines, drains, and wounds.   An After Visit Summary (AVS) was printed and given to the patient. Patient escorted via wheelchair, and discharged home via private auto.  Paulla Fore, RN

## 2019-03-26 LAB — CULTURE, BLOOD (ROUTINE X 2)
Culture: NO GROWTH
Culture: NO GROWTH
Special Requests: ADEQUATE
Special Requests: ADEQUATE

## 2020-11-13 IMAGING — US US RENAL
1 series · 14 of 25 positions shown · non-contrast
Comparison: None.

CLINICAL DATA: Initial evaluation for acute renal injury.

EXAM:
RENAL / URINARY TRACT ULTRASOUND COMPLETE

[Series 1: us renal · 14 of 28 slices shown]
[im 1/28]
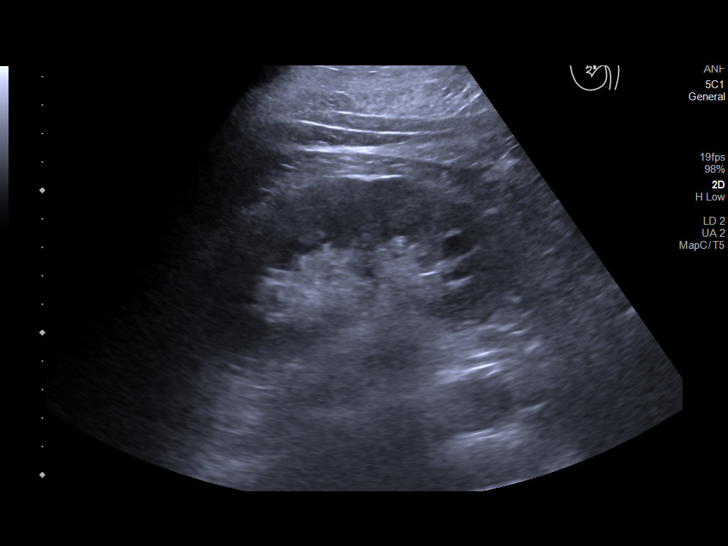
[im 3/28]
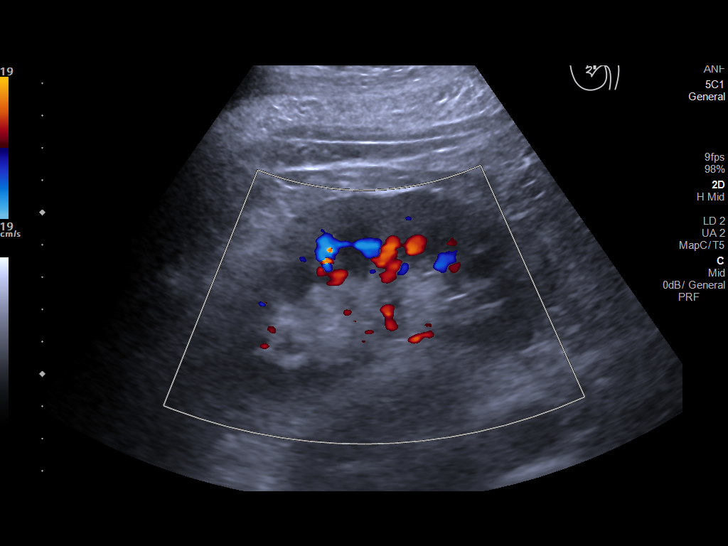
[im 5/28]
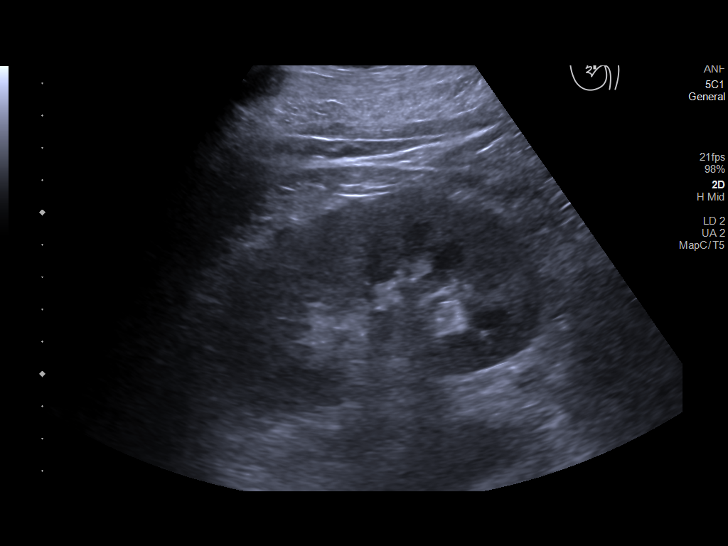
[im 7/28]
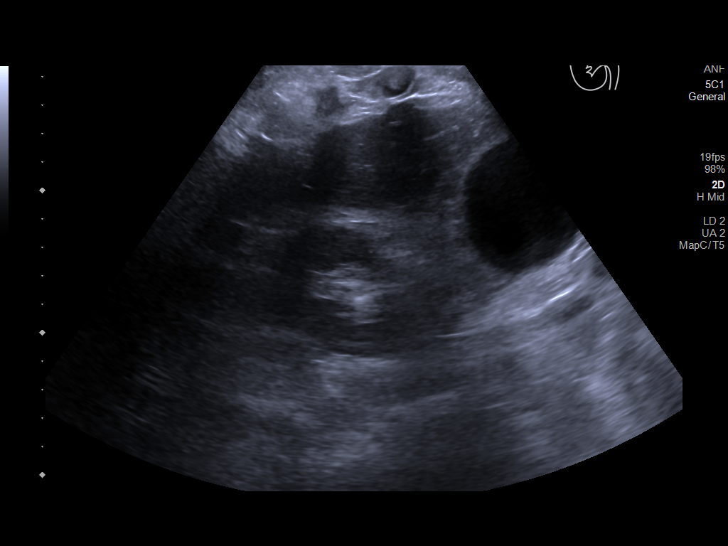
[im 10/28]
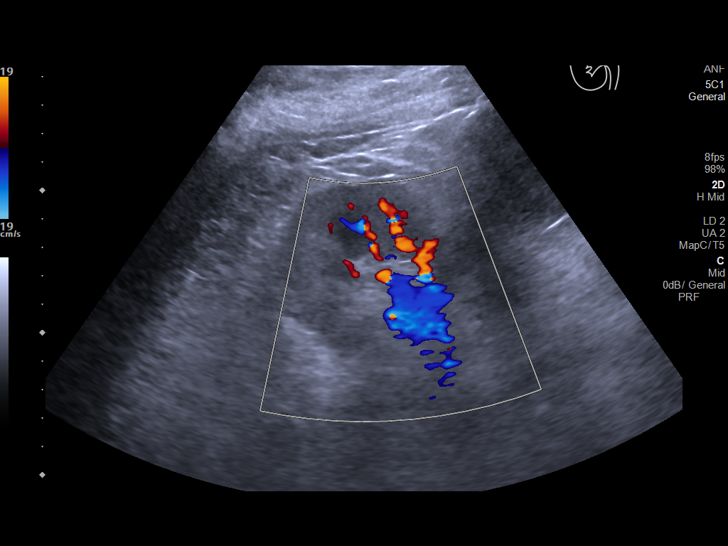
[im 11/28]
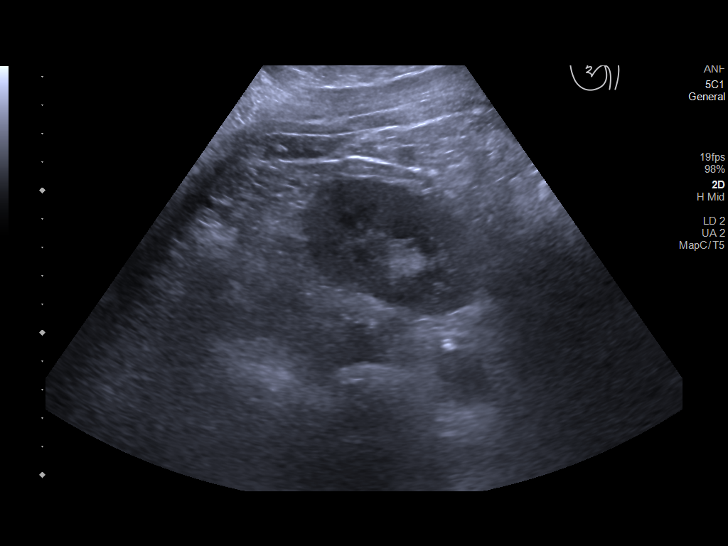
[im 13/28]
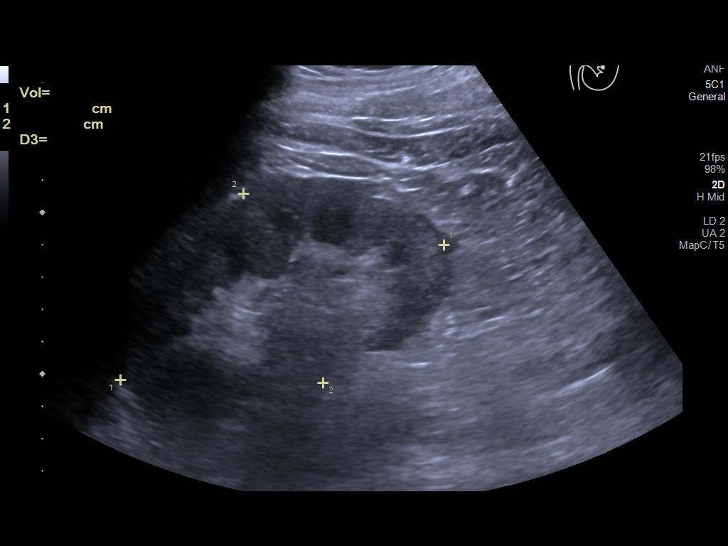
[im 15/28]
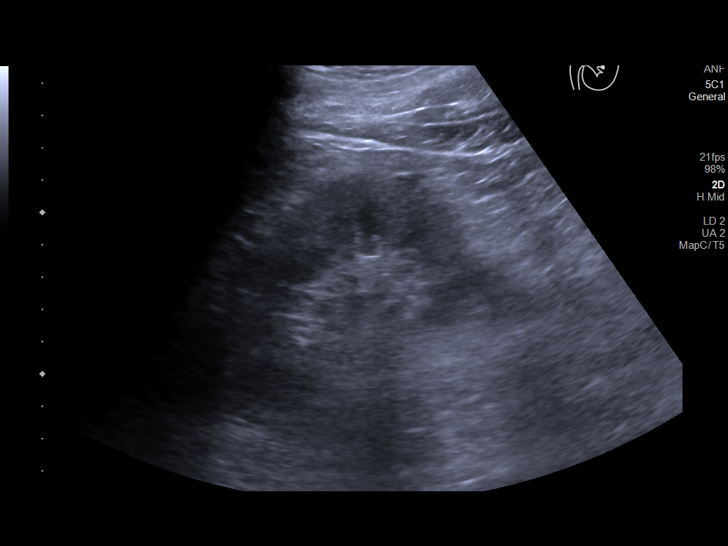
[im 17/28]
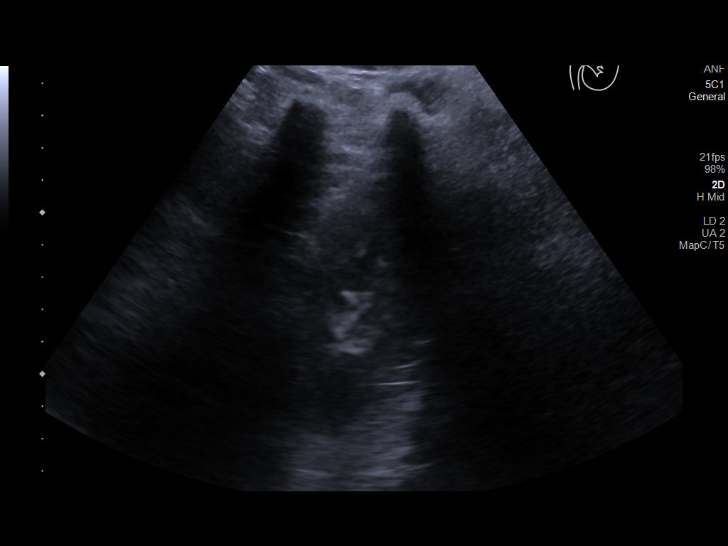
[im 19/28]
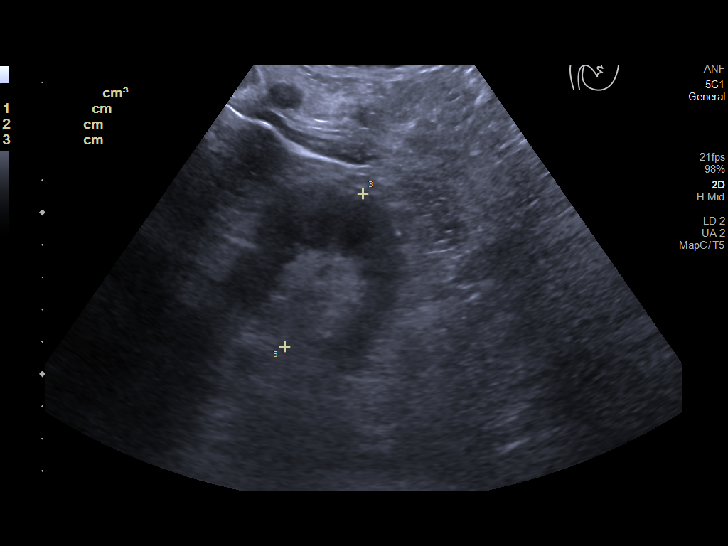
[im 21/28]
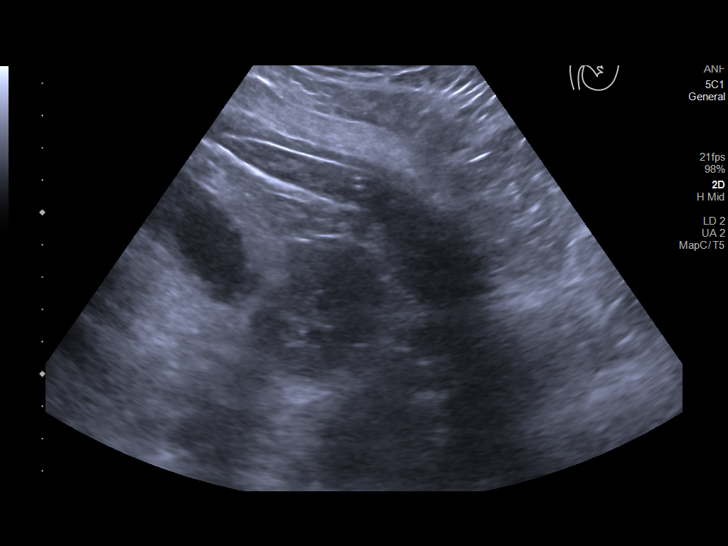
[im 23/28]
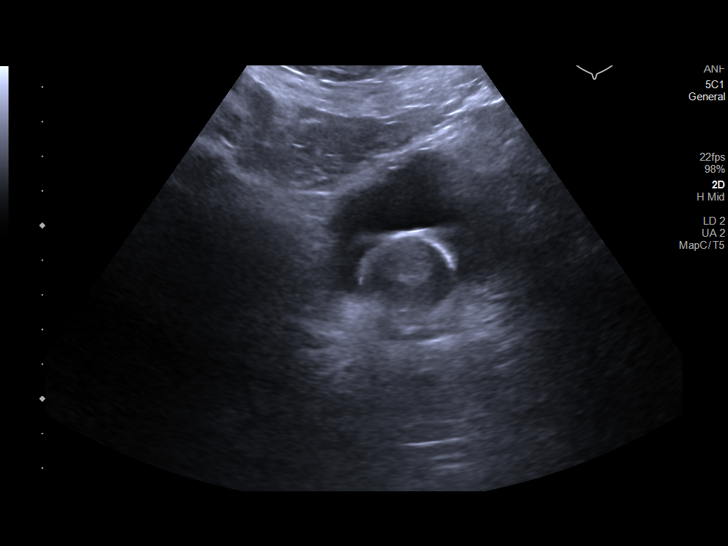
[im 25/28]
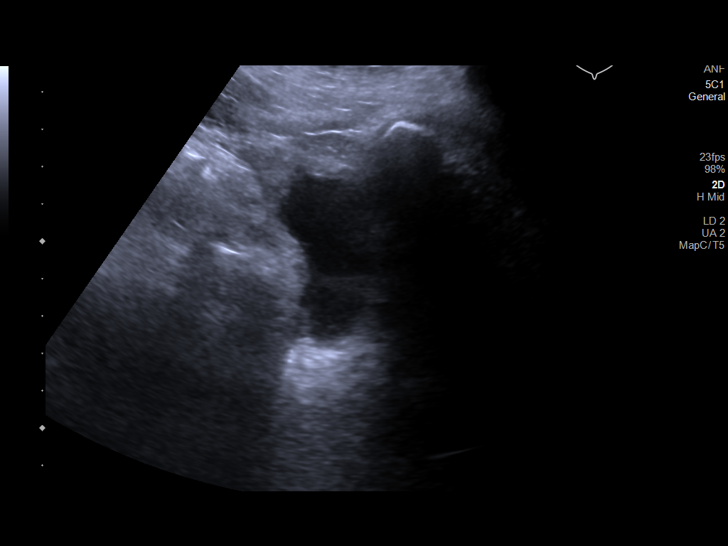
[im 28/28]
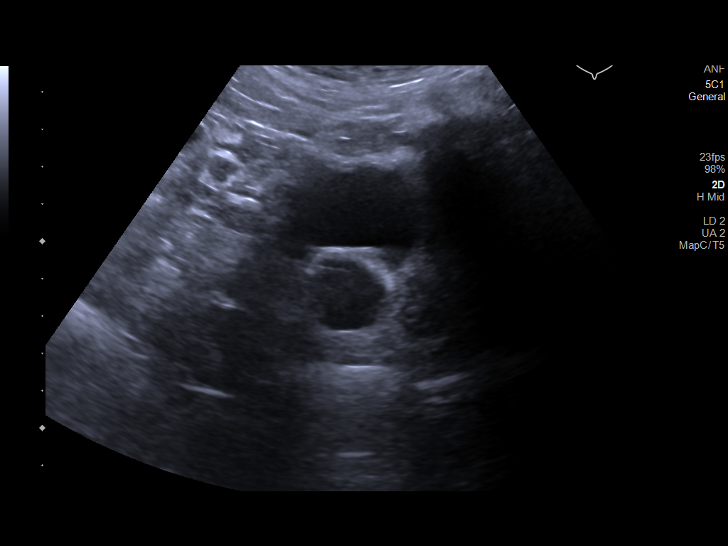

[14 of 25 positions shown; findings below may reference images not displayed]

FINDINGS: Right Kidney:

Renal measurements: 11.5 x 5.4 x 6.6 cm = volume: 212.3 mL.
Diffusely increased echogenicity within the renal parenchyma. No
hydronephrosis. No shadowing echogenic foci to suggest
nephrolithiasis. No focal renal mass.

Left Kidney:

Renal measurements: 10.9 x 6.4 x 5.3 cm = volume: 191.9 mL.
Diffusely increased echogenicity within the renal parenchyma. No
hydronephrosis. No shadowing echogenic foci to suggest
nephrolithiasis. No focal renal mass.

Bladder:

Bladder decompressed with a Foley catheter in place.
IMPRESSION: 1. Increased echogenicity within the renal parenchyma bilaterally,
compatible with medical renal disease.
2. No hydronephrosis.
3. Bladder decompressed with Foley catheter in place.
